# Patient Record
Sex: Male | Born: 1980
Health system: Southern US, Community
[De-identification: ages and names within clinical notes are randomized; demographics above are authoritative.]

## PROBLEM LIST (undated history)

## (undated) DIAGNOSIS — T8859XA Other complications of anesthesia, initial encounter: Secondary | ICD-10-CM

## (undated) HISTORY — PX: APPENDECTOMY: SHX54

---

## 2003-06-09 ENCOUNTER — Emergency Department (HOSPITAL_COMMUNITY): Admission: EM | Admit: 2003-06-09 | Discharge: 2003-06-09 | Payer: Self-pay | Admitting: Emergency Medicine

## 2003-06-10 ENCOUNTER — Inpatient Hospital Stay (HOSPITAL_COMMUNITY): Admission: EM | Admit: 2003-06-10 | Discharge: 2003-06-14 | Payer: Self-pay | Admitting: Emergency Medicine

## 2004-02-06 ENCOUNTER — Emergency Department (HOSPITAL_COMMUNITY): Admission: EM | Admit: 2004-02-06 | Discharge: 2004-02-06 | Payer: Self-pay | Admitting: *Deleted

## 2004-05-08 ENCOUNTER — Emergency Department (HOSPITAL_COMMUNITY): Admission: EM | Admit: 2004-05-08 | Discharge: 2004-05-08 | Payer: Self-pay | Admitting: Family Medicine

## 2006-08-01 ENCOUNTER — Emergency Department (HOSPITAL_COMMUNITY): Admission: EM | Admit: 2006-08-01 | Discharge: 2006-08-02 | Payer: Self-pay | Admitting: Emergency Medicine

## 2011-10-10 ENCOUNTER — Encounter (HOSPITAL_BASED_OUTPATIENT_CLINIC_OR_DEPARTMENT_OTHER): Payer: Self-pay | Admitting: Emergency Medicine

## 2011-10-10 ENCOUNTER — Emergency Department (HOSPITAL_BASED_OUTPATIENT_CLINIC_OR_DEPARTMENT_OTHER)
Admission: EM | Admit: 2011-10-10 | Discharge: 2011-10-10 | Disposition: A | Discharge status: Home / Self Care | Payer: 59 | Attending: Emergency Medicine | Admitting: Emergency Medicine

## 2011-10-10 DIAGNOSIS — I10 Essential (primary) hypertension: Secondary | ICD-10-CM | POA: Insufficient documentation

## 2011-10-10 DIAGNOSIS — T783XXA Angioneurotic edema, initial encounter: Secondary | ICD-10-CM | POA: Insufficient documentation

## 2011-10-10 DIAGNOSIS — E119 Type 2 diabetes mellitus without complications: Secondary | ICD-10-CM | POA: Insufficient documentation

## 2011-10-10 DIAGNOSIS — T464X5A Adverse effect of angiotensin-converting-enzyme inhibitors, initial encounter: Secondary | ICD-10-CM

## 2011-10-10 DIAGNOSIS — F17200 Nicotine dependence, unspecified, uncomplicated: Secondary | ICD-10-CM | POA: Insufficient documentation

## 2011-10-10 DIAGNOSIS — T46905A Adverse effect of unspecified agents primarily affecting the cardiovascular system, initial encounter: Secondary | ICD-10-CM | POA: Insufficient documentation

## 2011-10-10 MED ORDER — PREDNISONE 10 MG PO TABS: 20.0000 mg | ORAL_TABLET | Freq: Two times a day (BID) | ORAL | Status: DC

## 2011-10-10 MED ORDER — FAMOTIDINE 20 MG PO TABS
20.0000 mg | ORAL_TABLET | Freq: Once | ORAL | Status: AC
  Administered 2011-10-10: 20 mg via ORAL
  Filled 2011-10-10: qty 1

## 2011-10-10 MED ORDER — PREDNISONE 50 MG PO TABS
60.0000 mg | ORAL_TABLET | Freq: Once | ORAL | Status: AC
  Administered 2011-10-10: 60 mg via ORAL
  Filled 2011-10-10: qty 1

## 2011-10-10 NOTE — ED Notes (Signed)
Pt awoke at with swelling to lips, has taken bendadryl, no difficulty breathing or swallowing

## 2011-10-10 NOTE — ED Provider Notes (Signed)
Care assumed from Dr. Fonnie Jarvis.  I agree with his note, assessment, and plan.  The patient appears to have an Ace-induced angioedema.  He has not worsened while here.  On re-exam, there is no stridor and no shortness of breath.  He will be discharged with steroids, antihistamines, and instructed to stop his ace inhibitor.  He is to follow up with his pcp in the next few days.  Geoffery Lyons, MD 10/10/11 [phone]

## 2011-10-10 NOTE — ED Provider Notes (Signed)
History     CSN: [id number]  Arrival date & time 10/10/11  4098   First MD Initiated Contact with Patient 10/10/11 0703      Chief Complaint  Patient presents with  . Allergic Reaction    (Consider location/radiation/quality/duration/timing/severity/associated sxs/prior treatment) HPI This 31 year old male has a history of diabetes and hypertension and has been taking lisinopril for years. Yesterday evening he developed rather sudden onset of swelling to his lips mostly to his left upper lip. He has no known new exposures. He has no tongue swelling. He has no difficulty swallowing or speaking. He has no stridor or drooling. He has no chest pain or shortness of breath. He has no lightheadedness. He does not have any generalized rash. He did have a transient slight rash in his wrist which is now gone. He has taken 175 mg Benadryl since midnight with waxing and waning transient improvement of the swelling but not resolution. He does not have any generalized pruritus. He otherwise feels fine and has never had a reaction like this before. He has no pain. He has no itching now. Past Medical History  Diagnosis Date  . Diabetes mellitus    hypertension  Past Surgical History  Procedure Date  . Appendectomy     History reviewed. No pertinent family history.  History  Substance Use Topics  . Smoking status: Current Everyday Smoker -- 0.5 packs/day for 15 years    Types: Cigarettes  . Smokeless tobacco: Not on file  . Alcohol Use: No      Review of Systems  Constitutional: Negative for fever.       10 Systems reviewed and are negative for acute change except as noted in the HPI.  HENT: Positive for facial swelling. Negative for congestion.   Eyes: Negative for discharge and redness.  Respiratory: Negative for cough and shortness of breath.   Cardiovascular: Negative for chest pain.  Gastrointestinal: Negative for vomiting and abdominal pain.  Musculoskeletal: Negative for back  pain.  Skin: Negative for rash.  Neurological: Negative for syncope, numbness and headaches.  Psychiatric/Behavioral:       No behavior change.    Allergies  Review of patient's allergies indicates no known allergies.  Home Medications   Current Outpatient Rx  Name Route Sig Dispense Refill  . GLIPIZIDE 5 MG PO TABS Oral Take 5 mg by mouth 2 (two) times daily before a meal.    . LISINOPRIL 10 MG PO TABS Oral Take 10 mg by mouth daily.      BP 121/67  Pulse 71  Temp(Src) 98.8 F (37.1 C) (Oral)  Resp 18  SpO2 96%  Physical Exam  Nursing note and vitals reviewed. Constitutional:       Awake, alert, nontoxic appearance.  HENT:  Head: Atraumatic.  Mouth/Throat: Oropharynx is clear and moist.       Tongue is normal mucous membranes in his mouth are moist, his lips are mildly edematous in his left upper lip is mild to moderately edematous. There is no stridor, drooling, or airway compromise.  Eyes: Conjunctivae are normal. Pupils are equal, round, and reactive to light. Right eye exhibits no discharge. Left eye exhibits no discharge.  Neck: Neck supple.  Cardiovascular: Normal rate and regular rhythm.   No murmur heard. Pulmonary/Chest: Effort normal and breath sounds normal. No respiratory distress. He has no wheezes. He has no rales. He exhibits no tenderness.  Abdominal: Soft. There is no tenderness. There is no rebound.  Musculoskeletal: He exhibits no  tenderness.       Baseline ROM, no obvious new focal weakness.  Neurological:       Mental status and motor strength appears baseline for patient and situation.  Skin: No rash noted.  Psychiatric: He has a normal mood and affect.    ED Course  Procedures (including critical care time) Initial impression is mild angioedema likely related to ACE inhibitor blood pressure medication which he will have to discontinue his lisinopril, the patient's care is endorsed to Dr.Delo in the emergency department to further observe the  patient.Patient / Family / Caregiver understand and agree with initial ED impression and plan with expectations set for ED visit.  His disposition is pending. Labs Reviewed - No data to display No results found.   Dx: ACEI Angioedema   MDM  Dispo pnd.        Hurman Horn, MD 10/10/11 [phone]

## 2011-10-10 NOTE — ED Notes (Signed)
Pt developed swelling to lips overnight,  Took benadal overnight and swelling would decrease but then returned, per patient's wife he has taken total of 7 benadryl since 12am, no problems swallowing , no problems breathing, stated he developed small rash on wrist but no longer there. No new foods, no new medications, started on lantus 2 months ago,

## 2015-11-12 ENCOUNTER — Encounter: Payer: 59 | Attending: Endocrinology | Admitting: Dietician

## 2015-11-12 ENCOUNTER — Encounter: Payer: Self-pay | Admitting: Dietician

## 2015-11-12 VITALS — Ht 66.0 in | Wt 276.0 lb

## 2015-11-12 DIAGNOSIS — E119 Type 2 diabetes mellitus without complications: Secondary | ICD-10-CM | POA: Insufficient documentation

## 2015-11-12 DIAGNOSIS — E118 Type 2 diabetes mellitus with unspecified complications: Secondary | ICD-10-CM

## 2015-11-12 DIAGNOSIS — E1165 Type 2 diabetes mellitus with hyperglycemia: Secondary | ICD-10-CM

## 2015-11-12 DIAGNOSIS — Z794 Long term (current) use of insulin: Secondary | ICD-10-CM

## 2015-11-12 DIAGNOSIS — IMO0002 Reserved for concepts with insufficient information to code with codable children: Secondary | ICD-10-CM

## 2015-11-12 NOTE — Progress Notes (Signed)
Diabetes Self-Management Education  Visit Type: First/Initial  Appt. Start Time: 0820 Appt. End Time: 0950 Patient arrived 18 minutes late. Interpretation assistance from [employer] video interpreting Marijean Niemann [phone] for most of the session and [employer] [phone] briefly after Internet connection was lost.)  His wife works as an Engineer, technical sales at the health department and interpreted for the last discussion on portion control after Internet connection was lost again.  He understands a small amount of [place].  Written material provided in Spanish.  11/12/2015  [name], identified by name and date of birth, is a 35 y.o. male with a diagnosis of Diabetes: Type 2.   Patient lives with his wife and daughter.  His wife does most of the cooking and shopping.  He has had to decrease his portion sizes and verbalized need to lose weight.  He states that his blood sugar has been under better control since seeing an endocrinologist who has adjusted his insulin.  He is currently taking 18 units of Novolog before meals and 75 units of Levemir bid.  He is unemployed from [employer].  ASSESSMENT  Height  (1.676 m), weight 276 lb (125.193 kg). Body mass index is 44.57 kg/(m^2).   Weight hx 330 lbs 2 years ago.  He lost weight when he went back to [place] and became more active.  He reports caring for his mother-in-law who passed away 2 years ago.  Her death and death of his uncle were very stressful for him. Lowest weight 165 lbs 12 years ago when he got married.        Diabetes Self-Management Education - 11/12/15 5621    Visit Information   Visit Type First/Initial   Initial Visit   Diabetes Type Type 2   Are you currently following a meal plan? Yes   What type of meal plan do you follow? decreased portions   Are you taking your medications as prescribed? Yes   Date Diagnosed 6 years ago   Health Coping   How would you rate your overall health? Good   Psychosocial Assessment   Patient  Belief/Attitude about Diabetes Motivated to manage diabetes   Self-care barriers None   Self-management support Doctor's office;Family   Other persons present Patient;Spouse/SO   Patient Concerns Nutrition/Meal planning   Special Needs None   Preferred Learning Style No preference indicated   Learning Readiness Ready   How often do you need to have someone help you when you read instructions, pamphlets, or other written materials from your doctor or pharmacy? 1 - Never  when spanish   What is the last grade level you completed in school? none   Pre-Education Assessment   Patient understands the diabetes disease and treatment process. Needs Review   Patient understands incorporating nutritional management into lifestyle. Needs Review   Patient undertands incorporating physical activity into lifestyle. Needs Review   Patient understands using medications safely. Demonstrates understanding / competency   Patient understands monitoring blood glucose, interpreting and using results Needs Review   Patient understands prevention, detection, and treatment of acute complications. Needs Review   Patient understands prevention, detection, and treatment of chronic complications. Needs Review   Patient understands how to develop strategies to address psychosocial issues. Demonstrates understanding / competency   Patient understands how to develop strategies to promote health/change behavior. Needs Review   Complications   Last HgB A1C per patient/outside source 12 %  fall 2016   How often do you check your blood sugar? 3-4 times/day  Fasting Blood glucose range (mg/dL) 21-30870-129  657125 this am but was 200-300 2 months ago   Postprandial Blood glucose range (mg/dL) [phone]   Number of hypoglycemic episodes per month 0   Number of hyperglycemic episodes per week 0   Have you had a dilated eye exam in the past 12 months? Yes   Have you had a dental exam in the past 12 months? No   Are you checking your  feet? Yes   How many days per week are you checking your feet? 7   Dietary Intake   Breakfast 1 egg, 2 WW waffles, 1/2 banana, lite yogurt, coffee with splenda  7-7:30   Snack (morning) mexican soup with chips  10-11   Lunch chicken or [place]urkey, rice, vegetables  2   Snack (afternoon) fruit   Dinner baked chicken, rice with broccoli or cauliflower rice, vegetables, [employer] bread  7   Snack (evening) sometimes fruit or no sugar ice cream   Beverage(s) coffee with splenda, water, [place] flavored water diluted   Exercise   Exercise Type Light (walking / raking leaves)  treadmill but just starting to use this,  Thinks he will try 3 times per week.  He mows his lawn.   How many days per week to you exercise? 2   How many minutes per day do you exercise? 30   Total minutes per week of exercise 60   Patient Education   Previous Diabetes Education Yes (please comment)  only with the doctor   Disease state  Definition of diabetes, type 1 and 2, and the diagnosis of diabetes   Nutrition management  Role of diet in the treatment of diabetes and the relationship between the three main macronutrients and blood glucose level;Food label reading, portion sizes and measuring food.;Meal options for control of blood glucose level and chronic complications.   Physical activity and exercise  Role of exercise on diabetes management, blood pressure control and cardiac health.;Identified with patient nutritional and/or medication changes necessary with exercise.   Monitoring Identified appropriate SMBG and/or A1C goals.   Acute complications Taught treatment of hypoglycemia - the 15 rule.   Chronic complications Relationship between chronic complications and blood glucose control;Retinopathy and reason for yearly dilated eye exams;Reviewed with patient heart disease, higher risk of, and prevention;Assessed and discussed foot care and prevention of foot problems;Lipid levels, blood glucose control and heart disease    Psychosocial adjustment Worked with patient to identify barriers to care and solutions;Role of stress on diabetes   Personal strategies to promote health Helped patient develop diabetes management plan for (enter comment)  increasing exercise and decreasing portions   Individualized Goals (developed by patient)   Nutrition Follow meal plan discussed;General guidelines for healthy choices and portions discussed   Physical Activity Exercise 5-7 days per week;30 minutes per day   Medications take my medication as prescribed   Monitoring  test my blood glucose as discussed   Reducing Risk examine blood glucose patterns;do foot checks daily   Health Coping ask for help with (comment);discuss diabetes with (comment)  ask wife for support and help with meals as needed   Post-Education Assessment   Patient understands the diabetes disease and treatment process. Demonstrates understanding / competency   Patient understands incorporating nutritional management into lifestyle. Demonstrates understanding / competency   Patient undertands incorporating physical activity into lifestyle. Demonstrates understanding / competency   Patient understands using medications safely. Demonstrates understanding / competency   Patient understands monitoring blood glucose, interpreting  and using results Demonstrates understanding / competency   Patient understands prevention, detection, and treatment of acute complications. Demonstrates understanding / competency   Patient understands prevention, detection, and treatment of chronic complications. Demonstrates understanding / competency   Patient understands how to develop strategies to address psychosocial issues. Demonstrates understanding / competency   Patient understands how to develop strategies to promote health/change behavior. Demonstrates understanding / competency   Outcomes   Expected Outcomes Demonstrated interest in learning. Expect positive outcomes    Future DMSE PRN   Program Status Completed      Individualized Plan for Diabetes Self-Management Training:   Learning Objective:  Patient will have a greater understanding of diabetes self-management. Patient education plan is to attend individual and/or group sessions per assessed needs and concerns.   Plan:   Patient Instructions  Start an exercise habit.  Aim for 30-60 minutes of exercise most days.  Be as active as possible.  Plan:  Aim for 3 Carb Choices per meal (45 grams) +/- 1 either way  Aim for 0-1 Carbs per snack if hungry  Include protein in moderation with your meals and snacks Consider reading food labels for Total Carbohydrate and Fat Grams of foods Continue checking BG at alternate times per day as directed by MD  Continue taking medication as directed by MD     Expected Outcomes:  Demonstrated interest in learning. Expect positive outcomes  Education material provided: Living Well with Diabetes, A1C conversion sheet, Meal plan card, My Plate and Snack sheet, Hypoglycermia "rule of 15"  If problems or questions, patient to contact team via:  Phone and Email  Future DSME appointment: PRN

## 2015-11-12 NOTE — Patient Instructions (Signed)
Start an exercise habit.  Aim for 30-60 minutes of exercise most days.  Be as active as possible.  Plan:  Aim for 3 Carb Choices per meal (45 grams) +/- 1 either way  Aim for 0-1 Carbs per snack if hungry  Include protein in moderation with your meals and snacks Consider reading food labels for Total Carbohydrate and Fat Grams of foods Continue checking BG at alternate times per day as directed by MD  Continue taking medication as directed by MD

## 2016-07-27 DIAGNOSIS — E786 Lipoprotein deficiency: Secondary | ICD-10-CM | POA: Diagnosis not present

## 2016-07-27 DIAGNOSIS — E7800 Pure hypercholesterolemia, unspecified: Secondary | ICD-10-CM | POA: Diagnosis not present

## 2016-07-27 DIAGNOSIS — E1165 Type 2 diabetes mellitus with hyperglycemia: Secondary | ICD-10-CM | POA: Diagnosis not present

## 2016-11-04 DIAGNOSIS — J029 Acute pharyngitis, unspecified: Secondary | ICD-10-CM | POA: Diagnosis not present

## 2017-01-25 DIAGNOSIS — E1165 Type 2 diabetes mellitus with hyperglycemia: Secondary | ICD-10-CM | POA: Diagnosis not present

## 2017-01-25 DIAGNOSIS — E786 Lipoprotein deficiency: Secondary | ICD-10-CM | POA: Diagnosis not present

## 2017-06-02 DIAGNOSIS — H5213 Myopia, bilateral: Secondary | ICD-10-CM | POA: Diagnosis not present

## 2017-06-02 DIAGNOSIS — E119 Type 2 diabetes mellitus without complications: Secondary | ICD-10-CM | POA: Diagnosis not present

## 2017-07-03 DIAGNOSIS — R10819 Abdominal tenderness, unspecified site: Secondary | ICD-10-CM | POA: Diagnosis not present

## 2017-07-06 DIAGNOSIS — R1905 Periumbilic swelling, mass or lump: Secondary | ICD-10-CM | POA: Diagnosis not present

## 2017-07-21 DIAGNOSIS — E1165 Type 2 diabetes mellitus with hyperglycemia: Secondary | ICD-10-CM | POA: Diagnosis not present

## 2017-07-21 DIAGNOSIS — E7800 Pure hypercholesterolemia, unspecified: Secondary | ICD-10-CM | POA: Diagnosis not present

## 2017-07-28 DIAGNOSIS — E786 Lipoprotein deficiency: Secondary | ICD-10-CM | POA: Diagnosis not present

## 2017-07-28 DIAGNOSIS — E1165 Type 2 diabetes mellitus with hyperglycemia: Secondary | ICD-10-CM | POA: Diagnosis not present

## 2017-07-28 DIAGNOSIS — K432 Incisional hernia without obstruction or gangrene: Secondary | ICD-10-CM | POA: Diagnosis not present

## 2017-07-30 ENCOUNTER — Other Ambulatory Visit: Payer: Self-pay | Admitting: General Surgery

## 2017-07-30 DIAGNOSIS — K432 Incisional hernia without obstruction or gangrene: Secondary | ICD-10-CM

## 2017-08-06 ENCOUNTER — Ambulatory Visit
Admission: RE | Admit: 2017-08-06 | Discharge: 2017-08-06 | Disposition: A | Discharge status: Home / Self Care | Payer: 59 | Source: Ambulatory Visit | Attending: General Surgery | Admitting: General Surgery

## 2017-08-06 DIAGNOSIS — K432 Incisional hernia without obstruction or gangrene: Secondary | ICD-10-CM

## 2017-08-06 DIAGNOSIS — R1033 Periumbilical pain: Secondary | ICD-10-CM | POA: Diagnosis not present

## 2018-01-25 DIAGNOSIS — E7800 Pure hypercholesterolemia, unspecified: Secondary | ICD-10-CM | POA: Diagnosis not present

## 2018-01-25 DIAGNOSIS — E1165 Type 2 diabetes mellitus with hyperglycemia: Secondary | ICD-10-CM | POA: Diagnosis not present

## 2018-01-28 DIAGNOSIS — E786 Lipoprotein deficiency: Secondary | ICD-10-CM | POA: Diagnosis not present

## 2018-01-28 DIAGNOSIS — E1165 Type 2 diabetes mellitus with hyperglycemia: Secondary | ICD-10-CM | POA: Diagnosis not present

## 2018-05-17 DIAGNOSIS — E7800 Pure hypercholesterolemia, unspecified: Secondary | ICD-10-CM | POA: Diagnosis not present

## 2018-05-17 DIAGNOSIS — E1165 Type 2 diabetes mellitus with hyperglycemia: Secondary | ICD-10-CM | POA: Diagnosis not present

## 2018-05-24 DIAGNOSIS — E1165 Type 2 diabetes mellitus with hyperglycemia: Secondary | ICD-10-CM | POA: Diagnosis not present

## 2018-05-24 DIAGNOSIS — Z23 Encounter for immunization: Secondary | ICD-10-CM | POA: Diagnosis not present

## 2018-05-24 DIAGNOSIS — E786 Lipoprotein deficiency: Secondary | ICD-10-CM | POA: Diagnosis not present

## 2019-11-15 ENCOUNTER — Other Ambulatory Visit: Payer: Self-pay | Admitting: Family Medicine

## 2019-11-15 DIAGNOSIS — R1905 Periumbilic swelling, mass or lump: Secondary | ICD-10-CM

## 2019-12-28 ENCOUNTER — Ambulatory Visit
Admission: RE | Admit: 2019-12-28 | Discharge: 2019-12-28 | Disposition: A | Discharge status: Home / Self Care | Payer: 59 | Source: Ambulatory Visit | Attending: Family Medicine | Admitting: Family Medicine

## 2019-12-28 ENCOUNTER — Other Ambulatory Visit: Payer: Self-pay

## 2019-12-28 DIAGNOSIS — R1905 Periumbilic swelling, mass or lump: Secondary | ICD-10-CM

## 2019-12-28 MED ORDER — IOPAMIDOL (ISOVUE-300) INJECTION 61%
100.0000 mL | Freq: Once | INTRAVENOUS | Status: AC | PRN
  Administered 2019-12-28: 100 mL via INTRAVENOUS

## 2020-05-09 ENCOUNTER — Other Ambulatory Visit (HOSPITAL_COMMUNITY): Payer: 59

## 2020-05-20 ENCOUNTER — Other Ambulatory Visit (HOSPITAL_COMMUNITY)
Admission: RE | Admit: 2020-05-20 | Discharge: 2020-05-20 | Disposition: A | Discharge status: Home / Self Care | Payer: 59 | Source: Ambulatory Visit | Attending: General Surgery | Admitting: General Surgery

## 2020-05-20 DIAGNOSIS — Z01812 Encounter for preprocedural laboratory examination: Secondary | ICD-10-CM | POA: Diagnosis present

## 2020-05-20 DIAGNOSIS — Z20822 Contact with and (suspected) exposure to covid-19: Secondary | ICD-10-CM | POA: Diagnosis not present

## 2020-05-20 LAB — SARS CORONAVIRUS 2 (TAT 6-24 HRS): SARS Coronavirus 2: NEGATIVE

## 2020-05-22 ENCOUNTER — Ambulatory Visit: Payer: Self-pay | Admitting: General Surgery

## 2020-05-22 ENCOUNTER — Encounter (HOSPITAL_COMMUNITY): Payer: Self-pay | Admitting: General Surgery

## 2020-05-22 NOTE — H&P (View-Only) (Signed)
History of Present Illness Axel Filler MD; 03/25/2020 10:56 AM) The patient is a 39 year old male who presents with an incisional hernia. Referred by: [email or web address] TIW Chief Complaint: Abdominal wall mass  Patient is a 39 year old male with a right periumbilical mass. Back up after meeting seen 2 years ago Patient states that he does have pain when he stretches or bends over. Patient states he has pain when he is been out of work. 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 Patient Patient ha describes a pain as crampy-like fashion.  He has had a previous lapper scopic appendectomy 2005.   CT scan review shows a small umbilical, incisional hernia.    Past Surgical History Micheal Likens, CMA; 03/25/2020 10:36 AM) Appendectomy   Diagnostic Studies History (Chanel Lonni Fix, CMA; 03/25/2020 10:36 AM) Colonoscopy  never  Allergies (Chanel Lonni Fix, CMA; 03/25/2020 10:37 AM) Enalapril Maleate *ANTIHYPERTENSIVES*  Anaphylaxis. Allergies Reconciled   Medication History (Chanel Lonni Fix, CMA; 03/25/2020 10:37 AM) Atorvastatin Calcium (40MG  Tablet, Oral) Active. NovoFine (32G X 6 MM Misc,) Active. Synjardy XR (12.5-1000MG  Tablet ER 24HR, Oral) Active. Losartan Potassium (25MG  Tablet, Oral) Active. Levemir FlexTouch (100UNIT/ML Soln Pen-inj, Subcutaneous) Active. Aspirin (81MG  Tablet, Oral) Active. Lipitor (40MG  Tablet, Oral) Active. Medications Reconciled  Social History , CMA; 03/25/2020 10:36 AM) Alcohol use  Occasional alcohol use. Caffeine use  Coffee, Tea. Illicit drug use  Remotely quit drug use. Tobacco use  Current every day smoker.  Family History (Chanel , CMA; 03/25/2020 10:36 AM) Alcohol Abuse  Brother, Family Members In [place], Father, Sister, Son. Arthritis  Brother, Mother, Sister. Depression  Mother. Diabetes Mellitus  Family Members In General, Mother.  Other Problems 03/27/2020, CMA; 03/25/2020 10:36 AM) Alcohol  Abuse  Diabetes Mellitus  Gastroesophageal Reflux Disease  Heart murmur  Hemorrhoids     Review of Systems 03/27/2020 MD; 03/25/2020 10:55 AM) General Not Present- Appetite Loss, Chills, Fatigue, Fever, Night Sweats, Weight Gain and Weight Loss. Skin Present- Dryness. Not Present- Change in Wart/Mole, Hives, Jaundice, New Lesions, Non-Healing Wounds, Rash and Ulcer. HEENT Present- Seasonal Allergies and Wears glasses/contact lenses. Not Present- Earache, Hearing Loss, Hoarseness, Nose Bleed, Oral Ulcers, Ringing in the Ears, Sinus Pain, Sore Throat, Visual Disturbances and Yellow Eyes. Respiratory Present- Chronic Cough and Snoring. Not Present- Bloody sputum, Difficulty Breathing and Wheezing. Breast Not Present- Breast Mass, Breast Pain, Nipple Discharge and Skin Changes. Cardiovascular Present- Leg Cramps. Not Present- Chest Pain, Difficulty Breathing Lying Down, Palpitations, Rapid Heart Rate, Shortness of Breath and Swelling of Extremities. Gastrointestinal Present- Abdominal Pain, Bloating, Constipation, Gets full quickly at meals, Hemorrhoids, Indigestion and Rectal Pain. Not Present- Bloody Stool, Change in Bowel Habits, Chronic diarrhea, Difficulty Swallowing, Excessive gas, Nausea and Vomiting. Male Genitourinary Not Present- Blood in Urine, Change in Urinary Stream, Frequency, Impotence, Nocturia, Painful Urination, Urgency and Urine Leakage. All other systems negative  Vitals (Chanel Nolan CMA; 03/25/2020 10:37 AM) 03/25/2020 10:37 AM Weight: 275.5 lb Height: 65.5in Body Surface Area: 2.28 m Body Mass Index: 45.15 kg/m  Temp.: 97.11F  Pulse: 83 (Regular)  BP: 130/80(Sitting, Left Arm, Standard)       Physical Exam Axel Filler MD; 03/25/2020 10:57 AM) The physical exam findings are as follows: Note: Constitutional: No acute distress, conversant, appears stated age  Eyes: Anicteric sclerae, moist conjunctiva, no lid lag  Neck: No  thyromegaly, trachea midline, no cervical lymphadenopathy  Lungs: Clear to auscultation biilaterally, normal respiratory effot  Cardiovascular: regular rate & rhythm, no murmurs, no peripheal edema, pedal pulses 2+  GI: Soft, no masses or hepatosplenomegaly, non-tender to palpation, patient does have a small incisional umbilical hernia. MSK: Normal gait, no clubbing cyanosis, edema  Skin: No rashes, palpation reveals normal skin turgor  Psychiatric: Appropriate judgment and insight, oriented to person, place, and time 40    Assessment & Plan (Jewell Ryans MD; 03/25/2020 10:57 AM) INCISIONAL HERNIA, WITHOUT OBSTRUCTION OR GANGRENE (K43.2) Impression: 36-year-old male with a right periumbilical mass, subcutaneous. Patient has a small subcentimeter incisional hernia. 1. Will proceed to the operating for open umbilical hernia with possible Mesh 2. All risks and benefits were discussed with the patient to generally include, but not limited to: infection, bleeding, damage to surrounding structures, acute and chronic nerve pain, and recurrence. Alternatives were offered and described. All questions were answered and the patient voiced understanding of the procedure and wishes to proceed at this point with hernia repair. 

## 2020-05-22 NOTE — H&P (Signed)
History of Present Illness Axel Filler MD; 03/25/2020 10:56 AM) The patient is a 39 year old male who presents with an incisional hernia. Referred by: [email or web address] TIW Chief Complaint: Abdominal wall mass  Patient is a 39 year old male with a right periumbilical mass. Back up after meeting seen 2 years ago Patient states that he does have pain when he stretches or bends over. Patient states he has pain when he is been out of work. 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 Patient Patient ha describes a pain as crampy-like fashion.  He has had a previous lapper scopic appendectomy 2005.   CT scan review shows a small umbilical, incisional hernia.    Past Surgical History Micheal Likens, CMA; 03/25/2020 10:36 AM) Appendectomy   Diagnostic Studies History (Chanel Lonni Fix, CMA; 03/25/2020 10:36 AM) Colonoscopy  never  Allergies (Chanel Lonni Fix, CMA; 03/25/2020 10:37 AM) Enalapril Maleate *ANTIHYPERTENSIVES*  Anaphylaxis. Allergies Reconciled   Medication History (Chanel Lonni Fix, CMA; 03/25/2020 10:37 AM) Atorvastatin Calcium (40MG  Tablet, Oral) Active. NovoFine (32G X 6 MM Misc,) Active. Synjardy XR (12.5-1000MG  Tablet ER 24HR, Oral) Active. Losartan Potassium (25MG  Tablet, Oral) Active. Levemir FlexTouch (100UNIT/ML Soln Pen-inj, Subcutaneous) Active. Aspirin (81MG  Tablet, Oral) Active. Lipitor (40MG  Tablet, Oral) Active. Medications Reconciled  Social History , CMA; 03/25/2020 10:36 AM) Alcohol use  Occasional alcohol use. Caffeine use  Coffee, Tea. Illicit drug use  Remotely quit drug use. Tobacco use  Current every day smoker.  Family History (Chanel , CMA; 03/25/2020 10:36 AM) Alcohol Abuse  Brother, Family Members In [place], Father, Sister, Son. Arthritis  Brother, Mother, Sister. Depression  Mother. Diabetes Mellitus  Family Members In General, Mother.  Other Problems 03/27/2020, CMA; 03/25/2020 10:36 AM) Alcohol  Abuse  Diabetes Mellitus  Gastroesophageal Reflux Disease  Heart murmur  Hemorrhoids     Review of Systems 03/27/2020 MD; 03/25/2020 10:55 AM) General Not Present- Appetite Loss, Chills, Fatigue, Fever, Night Sweats, Weight Gain and Weight Loss. Skin Present- Dryness. Not Present- Change in Wart/Mole, Hives, Jaundice, New Lesions, Non-Healing Wounds, Rash and Ulcer. HEENT Present- Seasonal Allergies and Wears glasses/contact lenses. Not Present- Earache, Hearing Loss, Hoarseness, Nose Bleed, Oral Ulcers, Ringing in the Ears, Sinus Pain, Sore Throat, Visual Disturbances and Yellow Eyes. Respiratory Present- Chronic Cough and Snoring. Not Present- Bloody sputum, Difficulty Breathing and Wheezing. Breast Not Present- Breast Mass, Breast Pain, Nipple Discharge and Skin Changes. Cardiovascular Present- Leg Cramps. Not Present- Chest Pain, Difficulty Breathing Lying Down, Palpitations, Rapid Heart Rate, Shortness of Breath and Swelling of Extremities. Gastrointestinal Present- Abdominal Pain, Bloating, Constipation, Gets full quickly at meals, Hemorrhoids, Indigestion and Rectal Pain. Not Present- Bloody Stool, Change in Bowel Habits, Chronic diarrhea, Difficulty Swallowing, Excessive gas, Nausea and Vomiting. Male Genitourinary Not Present- Blood in Urine, Change in Urinary Stream, Frequency, Impotence, Nocturia, Painful Urination, Urgency and Urine Leakage. All other systems negative  Vitals (Chanel Nolan CMA; 03/25/2020 10:37 AM) 03/25/2020 10:37 AM Weight: 275.5 lb Height: 65.5in Body Surface Area: 2.28 m Body Mass Index: 45.15 kg/m  Temp.: 97.11F  Pulse: 83 (Regular)  BP: 130/80(Sitting, Left Arm, Standard)       Physical Exam Axel Filler MD; 03/25/2020 10:57 AM) The physical exam findings are as follows: Note: Constitutional: No acute distress, conversant, appears stated age  Eyes: Anicteric sclerae, moist conjunctiva, no lid lag  Neck: No  thyromegaly, trachea midline, no cervical lymphadenopathy  Lungs: Clear to auscultation biilaterally, normal respiratory effot  Cardiovascular: regular rate & rhythm, no murmurs, no peripheal edema, pedal pulses 2+  GI: Soft, no masses or hepatosplenomegaly, non-tender to palpation, patient does have a small incisional umbilical hernia. MSK: Normal gait, no clubbing cyanosis, edema  Skin: No rashes, palpation reveals normal skin turgor  Psychiatric: Appropriate judgment and insight, oriented to person, place, and time 38    Assessment & Plan Axel Filler MD; 03/25/2020 10:57 AM) Sherald Hess HERNIA, WITHOUT OBSTRUCTION OR GANGRENE (K43.2) Impression: 39 year old male with a right periumbilical mass, subcutaneous. Patient has a small subcentimeter incisional hernia. 1. Will proceed to the operating for open umbilical hernia with possible Mesh 2. All risks and benefits were discussed with the patient to generally include, but not limited to: infection, bleeding, damage to surrounding structures, acute and chronic nerve pain, and recurrence. Alternatives were offered and described. All questions were answered and the patient voiced understanding of the procedure and wishes to proceed at this point with hernia repair.

## 2020-05-22 NOTE — Progress Notes (Signed)
PCP:  Evert Kohl, MD Cardiologist:  Denies  EKG:  N/A CXR:  N/A ECHO:  Denies Stress Test:  Denies Cardiac Cath:  Denies  Fasting Blood Sugar-  90-130 Checks Blood Sugar__1-2_ times a day  ASA/Blood Thinners:  No  OSA/CPAP:  No  Covid test 12/13 negative  Anesthesia Review:  No  Patient denies shortness of breath, fever, cough, and chest pain at PAT appointment.  Patient verbalized understanding of instructions provided today at the PAT appointment.  Patient asked to review instructions at home and day of surgery.

## 2020-05-23 ENCOUNTER — Ambulatory Visit (HOSPITAL_COMMUNITY)
Admission: RE | Admit: 2020-05-23 | Discharge: 2020-05-23 | Disposition: A | Discharge status: Home / Self Care | Payer: 59 | Attending: General Surgery | Admitting: General Surgery

## 2020-05-23 ENCOUNTER — Ambulatory Visit (HOSPITAL_COMMUNITY): Payer: 59 | Admitting: Anesthesiology

## 2020-05-23 ENCOUNTER — Other Ambulatory Visit: Payer: Self-pay

## 2020-05-23 ENCOUNTER — Encounter (HOSPITAL_COMMUNITY): Payer: Self-pay | Admitting: General Surgery

## 2020-05-23 ENCOUNTER — Encounter (HOSPITAL_COMMUNITY)
Admission: RE | Disposition: A | Discharge status: Home / Self Care | Payer: Self-pay | Source: Home / Self Care | Attending: General Surgery

## 2020-05-23 DIAGNOSIS — K432 Incisional hernia without obstruction or gangrene: Secondary | ICD-10-CM | POA: Diagnosis not present

## 2020-05-23 DIAGNOSIS — Z7984 Long term (current) use of oral hypoglycemic drugs: Secondary | ICD-10-CM | POA: Diagnosis not present

## 2020-05-23 DIAGNOSIS — Z794 Long term (current) use of insulin: Secondary | ICD-10-CM | POA: Diagnosis not present

## 2020-05-23 DIAGNOSIS — Z6841 Body Mass Index (BMI) 40.0 and over, adult: Secondary | ICD-10-CM | POA: Diagnosis not present

## 2020-05-23 DIAGNOSIS — Z79899 Other long term (current) drug therapy: Secondary | ICD-10-CM | POA: Diagnosis not present

## 2020-05-23 DIAGNOSIS — F17200 Nicotine dependence, unspecified, uncomplicated: Secondary | ICD-10-CM | POA: Insufficient documentation

## 2020-05-23 DIAGNOSIS — Z7982 Long term (current) use of aspirin: Secondary | ICD-10-CM | POA: Diagnosis not present

## 2020-05-23 HISTORY — PX: UMBILICAL HERNIA REPAIR: SHX196

## 2020-05-23 HISTORY — DX: Other complications of anesthesia, initial encounter: T88.59XA

## 2020-05-23 LAB — CBC
HCT: 51 % (ref 39.0–52.0)
Hemoglobin: 16.3 g/dL (ref 13.0–17.0)
MCH: 28.7 pg (ref 26.0–34.0)
MCHC: 32 g/dL (ref 30.0–36.0)
MCV: 89.8 fL (ref 80.0–100.0)
Platelets: 202 10*3/uL (ref 150–400)
RBC: 5.68 MIL/uL (ref 4.22–5.81)
RDW: 13 % (ref 11.5–15.5)
WBC: 8.5 10*3/uL (ref 4.0–10.5)
nRBC: 0 % (ref 0.0–0.2)

## 2020-05-23 LAB — GLUCOSE, CAPILLARY
Glucose-Capillary: 162 mg/dL — ABNORMAL HIGH (ref 70–99)
Glucose-Capillary: 158 mg/dL — ABNORMAL HIGH (ref 70–99)

## 2020-05-23 LAB — BASIC METABOLIC PANEL
Anion gap: 9 (ref 5–15)
BUN: 13 mg/dL (ref 6–20)
CO2: 23 mmol/L (ref 22–32)
Calcium: 8.9 mg/dL (ref 8.9–10.3)
Chloride: 105 mmol/L (ref 98–111)
Creatinine, Ser: 0.69 mg/dL (ref 0.61–1.24)
GFR, Estimated: >60 mL/min (ref >60)
Glucose, Bld: 156 mg/dL — ABNORMAL HIGH (ref 70–99)
Potassium: 3.9 mmol/L (ref 3.5–5.1)
Sodium: 137 mmol/L (ref 135–145)

## 2020-05-23 SURGERY — REPAIR, HERNIA, UMBILICAL, ADULT
Anesthesia: General | Site: Abdomen

## 2020-05-23 MED ORDER — FENTANYL CITRATE (PF) 100 MCG/2ML IJ SOLN: 25.0000 ug | INTRAMUSCULAR | Status: DC | PRN

## 2020-05-23 MED ORDER — ORAL CARE MOUTH RINSE: 15.0000 mL | Freq: Once | OROMUCOSAL | Status: AC

## 2020-05-23 MED ORDER — PROMETHAZINE HCL 25 MG/ML IJ SOLN: 6.2500 mg | INTRAMUSCULAR | Status: DC | PRN

## 2020-05-23 MED ORDER — ONDANSETRON HCL 4 MG/2ML IJ SOLN
INTRAMUSCULAR | Status: DC | PRN
  Administered 2020-05-23: 4 mg via INTRAVENOUS

## 2020-05-23 MED ORDER — FENTANYL CITRATE (PF) 250 MCG/5ML IJ SOLN
INTRAMUSCULAR | Status: DC | PRN
  Administered 2020-05-23: 50 ug via INTRAVENOUS
  Administered 2020-05-23: 100 ug via INTRAVENOUS

## 2020-05-23 MED ORDER — CHLORHEXIDINE GLUCONATE 0.12 % MT SOLN
15.0000 mL | Freq: Once | OROMUCOSAL | Status: AC
  Administered 2020-05-23: 10:00:00 15 mL via OROMUCOSAL
  Filled 2020-05-23: qty 15

## 2020-05-23 MED ORDER — ACETAMINOPHEN 500 MG PO TABS
1000.0000 mg | ORAL_TABLET | ORAL | Status: AC
  Administered 2020-05-23: 10:00:00 1000 mg via ORAL
  Filled 2020-05-23: qty 2

## 2020-05-23 MED ORDER — BUPIVACAINE HCL (PF) 0.25 % IJ SOLN
INTRAMUSCULAR | Status: AC
  Filled 2020-05-23: qty 30

## 2020-05-23 MED ORDER — LACTATED RINGERS IV SOLN: INTRAVENOUS | Status: DC | PRN

## 2020-05-23 MED ORDER — PROPOFOL 10 MG/ML IV BOLUS
INTRAVENOUS | Status: DC | PRN
  Administered 2020-05-23: 200 mg via INTRAVENOUS

## 2020-05-23 MED ORDER — SUGAMMADEX SODIUM 200 MG/2ML IV SOLN
INTRAVENOUS | Status: DC | PRN
  Administered 2020-05-23: 400 mg via INTRAVENOUS

## 2020-05-23 MED ORDER — MIDAZOLAM HCL 2 MG/2ML IJ SOLN
INTRAMUSCULAR | Status: AC
  Filled 2020-05-23: qty 2

## 2020-05-23 MED ORDER — 0.9 % SODIUM CHLORIDE (POUR BTL) OPTIME
TOPICAL | Status: DC | PRN
  Administered 2020-05-23: 11:00:00 1000 mL

## 2020-05-23 MED ORDER — ROCURONIUM BROMIDE 10 MG/ML (PF) SYRINGE
PREFILLED_SYRINGE | INTRAVENOUS | Status: AC
  Filled 2020-05-23: qty 10

## 2020-05-23 MED ORDER — CHLORHEXIDINE GLUCONATE CLOTH 2 % EX PADS: 6.0000 | MEDICATED_PAD | Freq: Once | CUTANEOUS | Status: DC

## 2020-05-23 MED ORDER — FENTANYL CITRATE (PF) 250 MCG/5ML IJ SOLN
INTRAMUSCULAR | Status: AC
  Filled 2020-05-23: qty 5

## 2020-05-23 MED ORDER — ROCURONIUM BROMIDE 10 MG/ML (PF) SYRINGE
PREFILLED_SYRINGE | INTRAVENOUS | Status: DC | PRN
  Administered 2020-05-23: 70 mg via INTRAVENOUS

## 2020-05-23 MED ORDER — PROPOFOL 10 MG/ML IV BOLUS
INTRAVENOUS | Status: AC
  Filled 2020-05-23: qty 20

## 2020-05-23 MED ORDER — LIDOCAINE 2% (20 MG/ML) 5 ML SYRINGE
INTRAMUSCULAR | Status: DC | PRN
  Administered 2020-05-23: 100 mg via INTRAVENOUS

## 2020-05-23 MED ORDER — CELECOXIB 200 MG PO CAPS
400.0000 mg | ORAL_CAPSULE | ORAL | Status: AC
  Administered 2020-05-23: 10:00:00 400 mg via ORAL
  Filled 2020-05-23: qty 2

## 2020-05-23 MED ORDER — ONDANSETRON HCL 4 MG/2ML IJ SOLN
INTRAMUSCULAR | Status: AC
  Filled 2020-05-23: qty 2

## 2020-05-23 MED ORDER — CEFAZOLIN SODIUM-DEXTROSE 2-4 GM/100ML-% IV SOLN
2.0000 g | INTRAVENOUS | Status: AC
  Administered 2020-05-23: 11:00:00 1 g via INTRAVENOUS
  Administered 2020-05-23: 11:00:00 2 g via INTRAVENOUS
  Filled 2020-05-23: qty 100

## 2020-05-23 MED ORDER — LIDOCAINE 2% (20 MG/ML) 5 ML SYRINGE
INTRAMUSCULAR | Status: AC
  Filled 2020-05-23: qty 5

## 2020-05-23 MED ORDER — BUPIVACAINE HCL (PF) 0.25 % IJ SOLN
INTRAMUSCULAR | Status: DC | PRN
  Administered 2020-05-23: 20 mL

## 2020-05-23 MED ORDER — MIDAZOLAM HCL 5 MG/5ML IJ SOLN
INTRAMUSCULAR | Status: DC | PRN
  Administered 2020-05-23: 1 mg via INTRAVENOUS

## 2020-05-23 MED ORDER — LACTATED RINGERS IV SOLN: INTRAVENOUS | Status: DC

## 2020-05-23 MED ORDER — DEXAMETHASONE SODIUM PHOSPHATE 10 MG/ML IJ SOLN
INTRAMUSCULAR | Status: AC
  Filled 2020-05-23: qty 1

## 2020-05-23 MED ORDER — TRAMADOL HCL 50 MG PO TABS: 50.0000 mg | ORAL_TABLET | Freq: Four times a day (QID) | ORAL | 0 refills | Status: AC | PRN

## 2020-05-23 MED ORDER — DEXAMETHASONE SODIUM PHOSPHATE 10 MG/ML IJ SOLN
INTRAMUSCULAR | Status: DC | PRN
  Administered 2020-05-23: 5 mg via INTRAVENOUS

## 2020-05-23 SURGICAL SUPPLY — 38 items
ADH SKN CLS APL DERMABOND .7 (GAUZE/BANDAGES/DRESSINGS) ×1
APL PRP STRL LF DISP 70% ISPRP (MISCELLANEOUS) ×1
BLADE CLIPPER SURG (BLADE) ×2 IMPLANT
CANISTER SUCT 3000ML PPV (MISCELLANEOUS) ×3 IMPLANT
CHLORAPREP W/TINT 26 (MISCELLANEOUS) ×3 IMPLANT
COVER SURGICAL LIGHT HANDLE (MISCELLANEOUS) ×3 IMPLANT
COVER WAND RF STERILE (DRAPES) ×1 IMPLANT
DERMABOND ADVANCED (GAUZE/BANDAGES/DRESSINGS) ×2
DERMABOND ADVANCED .7 DNX12 (GAUZE/BANDAGES/DRESSINGS) ×1 IMPLANT
DRAPE LAPAROTOMY 100X72 PEDS (DRAPES) ×3 IMPLANT
ELECT REM PT RETURN 9FT ADLT (ELECTROSURGICAL) ×3
ELECTRODE REM PT RTRN 9FT ADLT (ELECTROSURGICAL) ×1 IMPLANT
GLOVE BIO SURGEON STRL SZ8 (GLOVE) ×3 IMPLANT
GLOVE BIOGEL PI IND STRL 8 (GLOVE) ×1 IMPLANT
GLOVE BIOGEL PI INDICATOR 8 (GLOVE) ×2
GOWN STRL REUS W/ TWL LRG LVL3 (GOWN DISPOSABLE) ×1 IMPLANT
GOWN STRL REUS W/ TWL XL LVL3 (GOWN DISPOSABLE) ×1 IMPLANT
GOWN STRL REUS W/TWL LRG LVL3 (GOWN DISPOSABLE) ×9
GOWN STRL REUS W/TWL XL LVL3 (GOWN DISPOSABLE) ×3
KIT BASIN OR (CUSTOM PROCEDURE TRAY) ×3 IMPLANT
KIT TURNOVER KIT B (KITS) ×3 IMPLANT
NDL HYPO 25GX1X1/2 BEV (NEEDLE) IMPLANT
NEEDLE 22X1 1/2 (OR ONLY) (NEEDLE) ×3 IMPLANT
NEEDLE HYPO 25GX1X1/2 BEV (NEEDLE) ×6 IMPLANT
NS IRRIG 1000ML POUR BTL (IV SOLUTION) ×3 IMPLANT
PACK GENERAL/GYN (CUSTOM PROCEDURE TRAY) ×3 IMPLANT
PAD ARMBOARD 7.5X6 YLW CONV (MISCELLANEOUS) ×3 IMPLANT
PENCIL SMOKE EVACUATOR (MISCELLANEOUS) ×3 IMPLANT
SUT ETHIBOND 0 MO6 C/R (SUTURE) ×2 IMPLANT
SUT MNCRL AB 4-0 PS2 18 (SUTURE) ×3 IMPLANT
SUT PROLENE 0 CT 1 30 (SUTURE) ×2 IMPLANT
SUT VIC AB 2-0 CT1 27 (SUTURE) ×3
SUT VIC AB 2-0 CT1 TAPERPNT 27 (SUTURE) ×1 IMPLANT
SUT VIC AB 3-0 SH 27 (SUTURE) ×3
SUT VIC AB 3-0 SH 27XBRD (SUTURE) ×1 IMPLANT
SYR CONTROL 10ML LL (SYRINGE) ×5 IMPLANT
TOWEL GREEN STERILE (TOWEL DISPOSABLE) ×3 IMPLANT
TOWEL GREEN STERILE FF (TOWEL DISPOSABLE) ×3 IMPLANT

## 2020-05-23 NOTE — Interval H&P Note (Signed)
History and Physical Interval Note:  05/23/2020 9:47 AM  Jerry Best  has presented today for surgery, with the diagnosis of INCISIONAL HERNIA.  The various methods of treatment have been discussed with the patient and family. After consideration of risks, benefits and other options for treatment, the patient has consented to  Procedure(s): OPEN UMBILICAL HERNIA REPAIR WITH MESH (N/A) as a surgical intervention.  The patient's history has been reviewed, patient examined, no change in status, stable for surgery.  I have reviewed the patient's chart and labs.  Questions were answered to the patient's satisfaction.     Axel Filler

## 2020-05-23 NOTE — Anesthesia Postprocedure Evaluation (Signed)
Anesthesia Post Note  Patient: Jerry Best  Procedure(s) Performed: OPEN UMBILICAL HERNIA REPAIR (N/A Abdomen)     Patient location during evaluation: PACU Anesthesia Type: General Level of consciousness: awake and alert, oriented and awake Pain management: pain level controlled Vital Signs Assessment: post-procedure vital signs reviewed and stable Respiratory status: spontaneous breathing, nonlabored ventilation and respiratory function stable Cardiovascular status: blood pressure returned to baseline and stable Postop Assessment: no apparent nausea or vomiting Anesthetic complications: no   No complications documented.  Last Vitals:  Vitals:   05/23/20 1205 05/23/20 1221  BP: (!) 124/98   Pulse: 64   Resp: 12   Temp:  (!) 36.3 C  SpO2: 95%     Last Pain:  Vitals:   05/23/20 1205  TempSrc:   PainSc: 2                  Cecile Hearing

## 2020-05-23 NOTE — Discharge Instructions (Signed)
[facility] _______[facility] ° °HERNIA REPAIR: POST OP INSTRUCTIONS ° °Always review your discharge instruction sheet given to you by the facility where your surgery was performed. °IF YOU HAVE DISABILITY OR FAMILY LEAVE FORMS, YOU MUST BRING THEM TO THE OFFICE FOR PROCESSING.   °DO NOT GIVE THEM TO YOUR DOCTOR. ° °1. A  prescription for pain medication may be given to you upon discharge.  Take your pain medication as prescribed, if needed.  If narcotic pain medicine is not needed, then you may take acetaminophen (Tylenol) or ibuprofen (Advil) as needed. °2. Take your usually prescribed medications unless otherwise directed. °If you need a refill on your pain medication, please contact your pharmacy.  They will contact our office to request authorization. Prescriptions will not be filled after 5 pm or on week-ends. °3. You should follow a light diet the first 24 hours after arrival home, such as soup and crackers, etc.  Be sure to include lots of fluids daily.  Resume your normal diet the day after surgery. °4.Most patients will experience some swelling and bruising around the umbilicus or in the groin and scrotum.  Ice packs and reclining will help.  Swelling and bruising can take several days to resolve.  °6. It is common to experience some constipation if taking pain medication after surgery.  Increasing fluid intake and taking a stool softener (such as Colace) will usually help or prevent this problem from occurring.  A mild laxative (Milk of Magnesia or Miralax) should be taken according to package directions if there are no bowel movements after 48 hours. °7. Unless discharge instructions indicate otherwise, you may remove your bandages 24-48 hours after surgery, and you may shower at that time.  You may have steri-strips (small skin tapes) in place directly over the incision.  These strips should be left on the skin for 7-10 days.  If your surgeon used skin glue on the incision, you may shower in  24 hours.  The glue will flake off over the next 2-3 weeks.  Any sutures or staples will be removed at the office during your follow-up visit. °8. ACTIVITIES:  You may resume regular (light) daily activities beginning the next day--such as daily self-care, walking, climbing stairs--gradually increasing activities as tolerated.  You may have sexual intercourse when it is comfortable.  Refrain from any heavy lifting or straining until approved by your doctor. ° °a.You may drive when you are no longer taking prescription pain medication, you can comfortably wear a seatbelt, and you can safely maneuver your car and apply brakes. °b.RETURN TO WORK:   °_____________________________________________ ° °9.You should see your doctor in the office for a follow-up appointment approximately 2-3 weeks after your surgery.  Make sure that you call for this appointment within a day or two after you arrive home to insure a convenient appointment time. °10.OTHER INSTRUCTIONS: _________________________ °   _____________________________________ ° °WHEN TO CALL YOUR DOCTOR: °1. Fever over 101.0 °2. Inability to urinate °3. Nausea and/or vomiting °4. Extreme swelling or bruising °5. Continued bleeding from incision. °6. Increased pain, redness, or drainage from the incision ° °The clinic staff is available to answer your questions during regular business hours.  Please don’t hesitate to call and ask to speak to one of the nurses for clinical concerns.  If you have a medical emergency, go to the nearest emergency room or call 911.  A surgeon from [facility] is always on call at the hospital ° ° °[address], [address], [place], [place]  [address] ? ° [address] [address], [place], [place]   [address] °[phone] ? [phone] ? FAX [phone] °Web site: [email or web address] ° °

## 2020-05-23 NOTE — Anesthesia Procedure Notes (Signed)
Procedure Name: Intubation Date/Time: 05/23/2020 11:02 AM Performed by: Quentin Ore, CRNA Pre-anesthesia Checklist: Patient identified, Emergency Drugs available, Suction available and Patient being monitored Patient Re-evaluated:Patient Re-evaluated prior to induction Oxygen Delivery Method: Circle System Utilized Preoxygenation: Pre-oxygenation with 100% oxygen Induction Type: IV induction Ventilation: Two handed mask ventilation required and Mask ventilation without difficulty Laryngoscope Size: Glidescope and 4 Grade View: Grade I Tube type: Oral Tube size: 7.5 mm Number of attempts: 1 Airway Equipment and Method: Rigid stylet and Video-laryngoscopy Placement Confirmation: ETT inserted through vocal cords under direct vision,  positive ETCO2 and breath sounds checked- equal and bilateral Secured at: 24 cm Tube secured with: Tape Dental Injury: Teeth and Oropharynx as per pre-operative assessment  Difficulty Due To: Difficulty was anticipated, Difficult Airway- due to large tongue and Difficult Airway- due to limited oral opening Comments: Atraumatic insertion. Elective Glidescope due to neck size and limited oral opening.

## 2020-05-23 NOTE — Op Note (Signed)
05/23/2020  11:18 AM  PATIENT:  Jerry Best  39 y.o. male  PRE-OPERATIVE DIAGNOSIS:  INCISIONAL HERNIA  POST-OPERATIVE DIAGNOSIS:  INCISIONAL HERNIA  PROCEDURE:  Procedure(s): OPEN UMBILICAL HERNIA REPAIR, PRIMARY REPAIR (N/A)  SURGEON:  Surgeon(s) and Role:    * Axel Filler, MD - Primary   ASSISTANTS: Lannette Donath, MD   ANESTHESIA:   local and general  EBL:  minimal   BLOOD ADMINISTERED:none  DRAINS: none   LOCAL MEDICATIONS USED:  BUPIVICAINE   SPECIMEN:  No Specimen  DISPOSITION OF SPECIMEN:  N/A  COUNTS:  YES  TOURNIQUET:  * No tourniquets in log *  DICTATION: .Dragon Dictation Details of procedure: After the patient was consented she was taken back to the operating room placed in supine position with bilateral SCDs in place.  The patient was prepped and draped in sterile fashion.  After appropriate antibiotics a timeout was called all facts verified.  At this time a curvilinear incision was made just inferior to the umbilicus.  Blunt dissection was taken down to the anterior fascia.  The umbilical stalk was then taken off the abdominal wall.  The fascia was then elevated at the umbilical hernia site.  The surrounding fascia was then cleaned out from the subcutaneous tissue.  The hernia was very tiny. The peritoneum was not entered.  The fascia was cleaned away from the surrounding tissue. The fascia was reapproximated from the mesh using 0 Ethibonds x3 in an interrupted fashion.  The umbilical stalk was then secured to the anterior abdominal wall using a 3-0 Vicryl x1.  The skin was then reapproximated 4-0 Monocryl subcuticular fashion.  The skin was then dressed with Dermabond.  The patient tolerated procedure well was taken to the recovery in stable condition.   PLAN OF CARE: Discharge to home after PACU  PATIENT DISPOSITION:  PACU - hemodynamically stable.   Delay start of Pharmacological VTE agent (>24hrs) due to surgical blood loss or risk of  bleeding: not applicable

## 2020-05-23 NOTE — Anesthesia Preprocedure Evaluation (Addendum)
Anesthesia Evaluation  Patient identified by MRN, date of birth, ID band Patient awake    Reviewed: Allergy & Precautions, NPO status , Patient's Chart, lab work & pertinent test results  Airway Mallampati: IV  TM Distance: >3 FB Neck ROM: Full    Dental  (+) Teeth Intact   Pulmonary Current Smoker and Patient abstained from smoking.,    Pulmonary exam normal breath sounds clear to auscultation       Cardiovascular hypertension, Pt. on medications Normal cardiovascular exam Rhythm:Regular Rate:Normal     Neuro/Psych negative neurological ROS  negative psych ROS   GI/Hepatic negative GI ROS, Neg liver ROS,   Endo/Other  diabetes, Type 2, Oral Hypoglycemic AgentsMorbid obesity  Renal/GU negative Renal ROS     Musculoskeletal negative musculoskeletal ROS (+)   Abdominal   Peds  Hematology negative hematology ROS (+)   Anesthesia Other Findings Day of surgery medications reviewed with the patient.  Reproductive/Obstetrics                            Anesthesia Physical Anesthesia Plan  ASA: III  Anesthesia Plan: General   Post-op Pain Management:    Induction: Intravenous  PONV Risk Score and Plan: 3 and Midazolam, Dexamethasone and Ondansetron  Airway Management Planned: Oral ETT and Video Laryngoscope Planned  Additional Equipment:   Intra-op Plan:   Post-operative Plan: Extubation in OR  Informed Consent: I have reviewed the patients History and Physical, chart, labs and discussed the procedure including the risks, benefits and alternatives for the proposed anesthesia with the patient or authorized representative who has indicated his/her understanding and acceptance.       Plan Discussed with: CRNA  Anesthesia Plan Comments:        Anesthesia Quick Evaluation

## 2020-05-23 NOTE — Transfer of Care (Signed)
Immediate Anesthesia Transfer of Care Note  Patient: Jerry Best  Procedure(s) Performed: OPEN UMBILICAL HERNIA REPAIR (N/A Abdomen)  Patient Location: PACU  Anesthesia Type:General  Level of Consciousness: awake, alert  and oriented  Airway & Oxygen Therapy: Patient Spontanous Breathing  Post-op Assessment: Report given to RN, Post -op Vital signs reviewed and stable and Patient moving all extremities X 4  Post vital signs: Reviewed and stable  Last Vitals:  Vitals Value Taken Time  BP    Temp    Pulse 90 05/23/20 1139  Resp 21 05/23/20 1139  SpO2 94 % 05/23/20 1139  Vitals shown include unvalidated device data.  Last Pain:  Vitals:   05/23/20 0931  TempSrc:   PainSc: 0-No pain      Patients Stated Pain Goal: 3 (05/23/20 0931)  Complications: No complications documented.

## 2020-05-24 ENCOUNTER — Encounter (HOSPITAL_COMMUNITY): Payer: Self-pay | Admitting: General Surgery

## 2020-12-14 IMAGING — CT CT ABD-PELV W/ CM
2 of 5 series · 17 of 46 positions shown, 19 images · IV contrast (iopamidol)
Comparison: CT dated June 10, 2003.

CLINICAL DATA: Right periumbilical mass. Previous appendectomy.
Concern for hernia.

EXAM:
CT ABDOMEN AND PELVIS WITH CONTRAST
TECHNIQUE: Multidetector CT imaging of the abdomen and pelvis was performed
using the standard protocol following bolus administration of
intravenous contrast.
CONTRAST:  100mL [id number] IOPAMIDOL ([id number]) INJECTION 61%

[Series 2: abd pelvis 5.00 br40 s3 axial · axial · 0.98mm/px · z∈[+1143,+1598]mm · 14 of 103 slices shown, 16 images]
[im 6/103  soft-tissue]
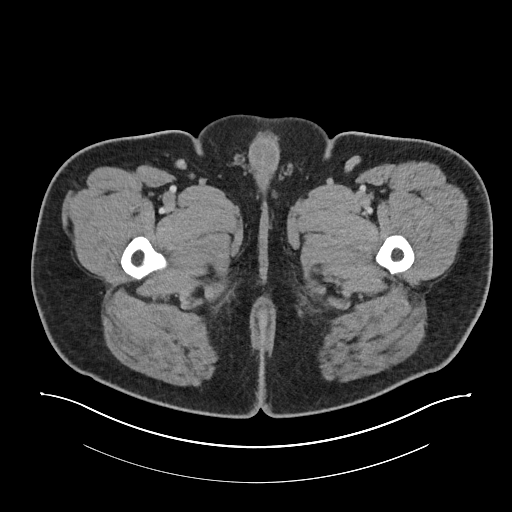
[im 6/103  bone]
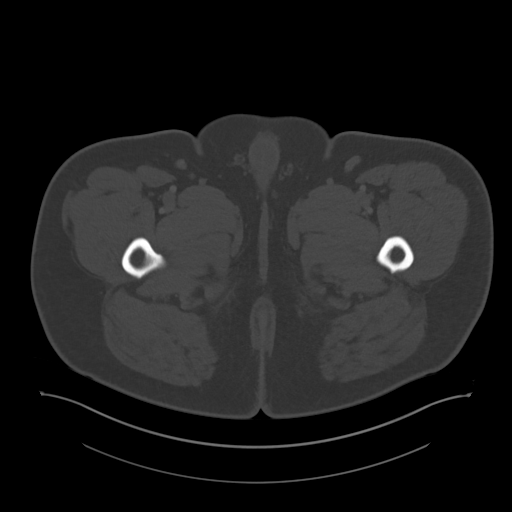
[im 12/103  soft-tissue]
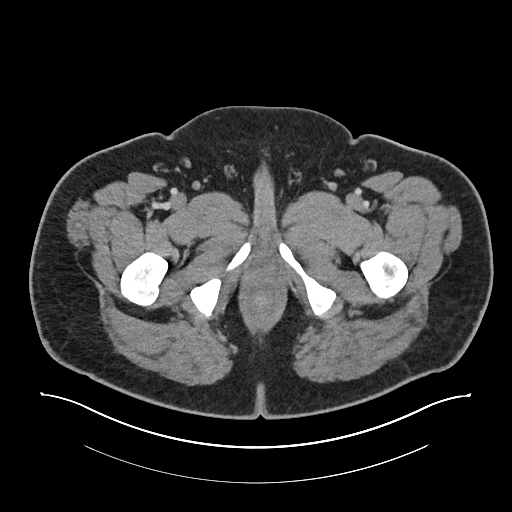
[im 23/103  soft-tissue]
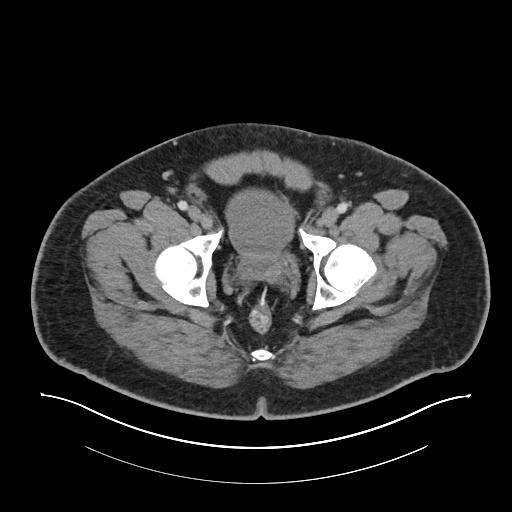
[im 29/103  soft-tissue]
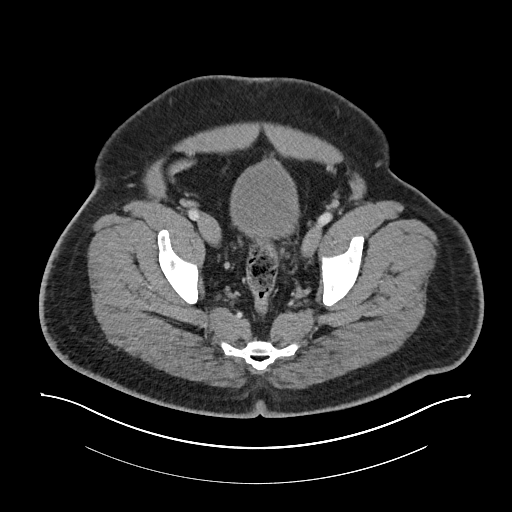
[im 35/103  soft-tissue]
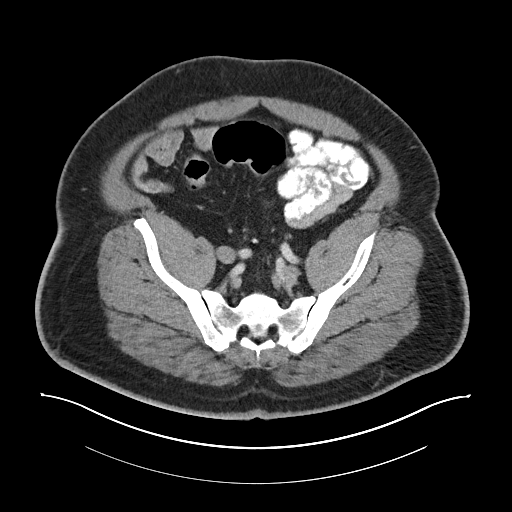
[im 40/103  soft-tissue]
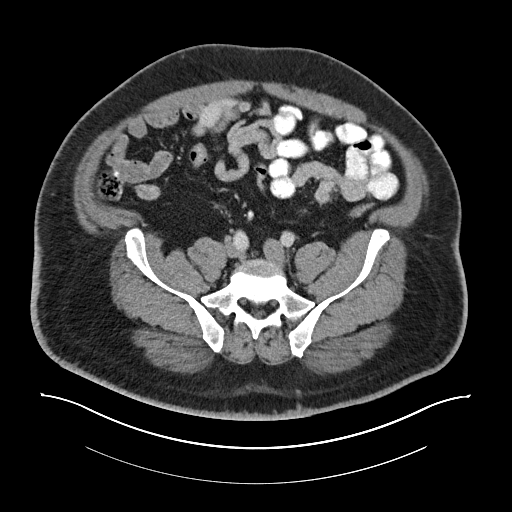
[im 46/103  soft-tissue]
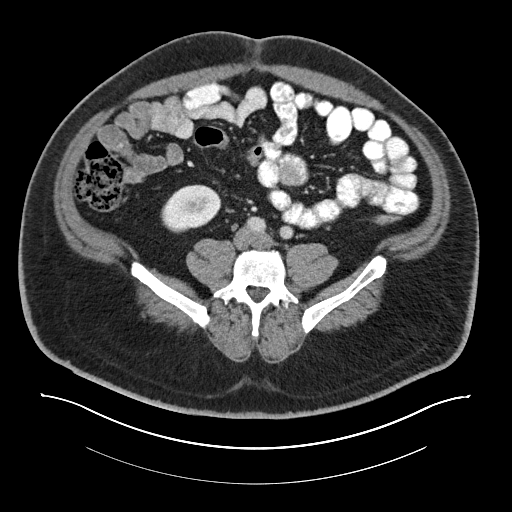
[im 57/103  soft-tissue]
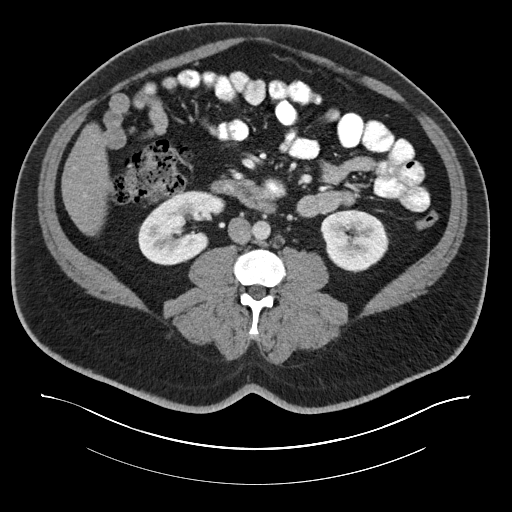
[im 63/103  soft-tissue]
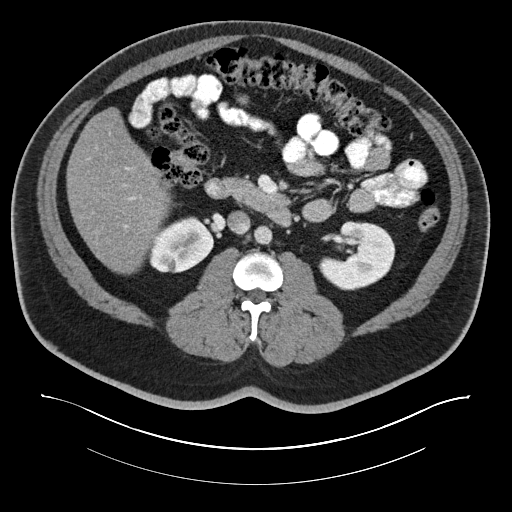
[im 63/103  bone]
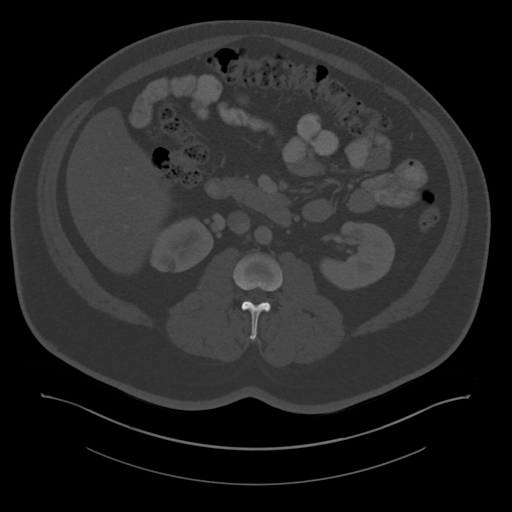
[im 69/103  soft-tissue]
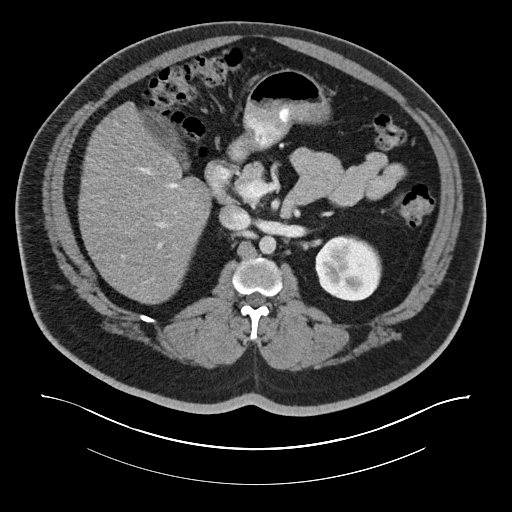
[im 74/103  soft-tissue]
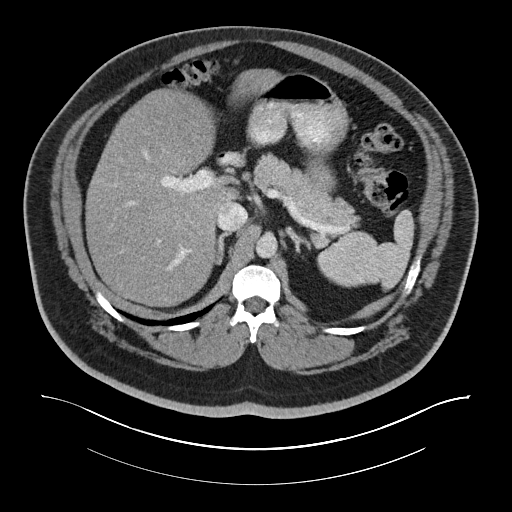
[im 80/103  soft-tissue]
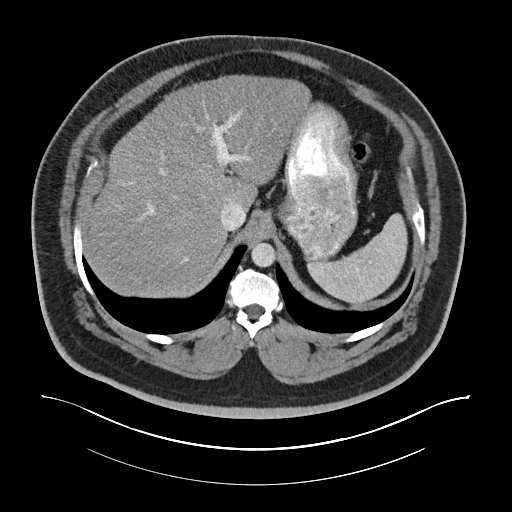
[im 91/103  soft-tissue]
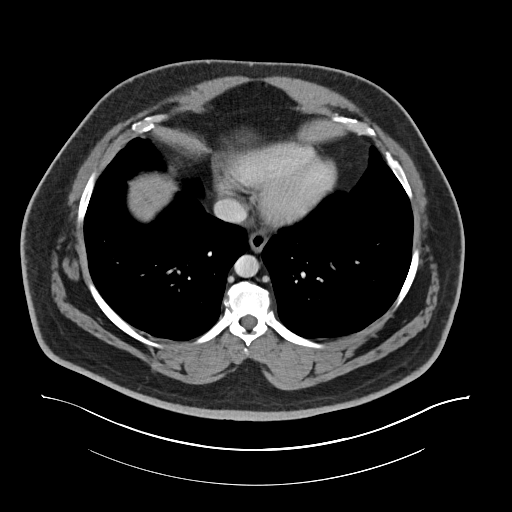
[im 97/103  soft-tissue]
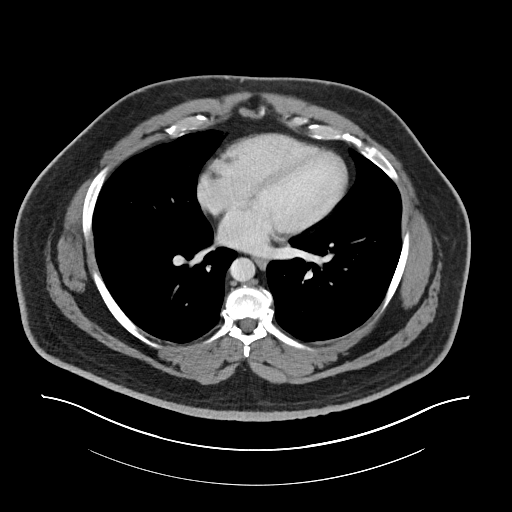

[Series 6: abd pelvis 2.00 br40 s3 cor · coronal · 0.98mm/px · 3 of 249 slices shown]
[im 83/249  soft-tissue]
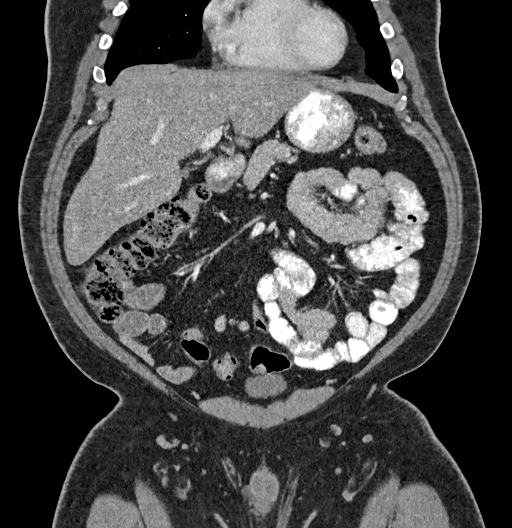
[im 111/249  soft-tissue]
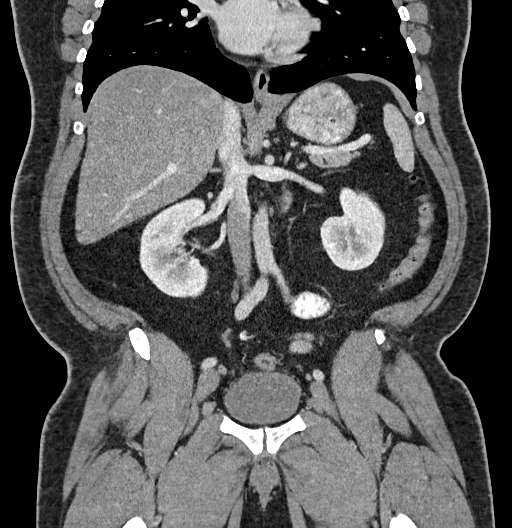
[im 138/249  soft-tissue]
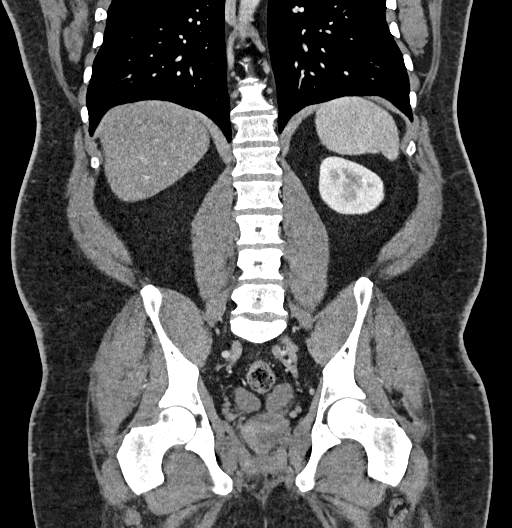

[17 of 46 positions shown; findings below may reference images not displayed]

FINDINGS: Lower chest: The lung bases are clear. The heart size is normal.

Hepatobiliary: There is decreased hepatic attenuation suggestive of
hepatic steatosis. Normal gallbladder.There is no biliary ductal
dilation.

Pancreas: Normal contours without ductal dilatation. No
peripancreatic fluid collection.

Spleen: Unremarkable.

Adrenals/Urinary Tract:

--Adrenal glands: Unremarkable.

--Right kidney/ureter: No hydronephrosis or radiopaque kidney
stones.

--Left kidney/ureter: No hydronephrosis or radiopaque kidney stones.

--Urinary bladder: Unremarkable.

Stomach/Bowel:

--Stomach/Duodenum: No hiatal hernia or other gastric abnormality.
Normal duodenal course and caliber.

--Small bowel: Unremarkable.

--Colon: Unremarkable.

--Appendix: Surgically absent.

Vascular/Lymphatic: Normal course and caliber of the major abdominal
vessels.

--No retroperitoneal lymphadenopathy.

--No mesenteric lymphadenopathy.

--No pelvic or inguinal lymphadenopathy.

Reproductive: Unremarkable

Other: No ascites or free air. There is a fat containing umbilical
hernia.

Musculoskeletal. No acute displaced fractures.
IMPRESSION: 1. No acute abdominopelvic abnormality.
2. Small fat containing umbilical hernia.
3. Hepatic steatosis.
4. Status post appendectomy.
